# Patient Record
Sex: Male | Born: 2000 | Race: White | Hispanic: No | Marital: Single | Smoking: Current every day smoker
Health system: Southern US, Community
[De-identification: ages and names within clinical notes are randomized; demographics above are authoritative.]

## PROBLEM LIST (undated history)

## (undated) DIAGNOSIS — F909 Attention-deficit hyperactivity disorder, unspecified type: Secondary | ICD-10-CM

## (undated) HISTORY — PX: FOREARM SURGERY: SHX651

---
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
## 2018-07-31 DIAGNOSIS — K580 Irritable bowel syndrome with diarrhea: Secondary | ICD-10-CM | POA: Diagnosis not present

## 2018-07-31 DIAGNOSIS — R319 Hematuria, unspecified: Secondary | ICD-10-CM | POA: Diagnosis not present

## 2018-08-04 DIAGNOSIS — R319 Hematuria, unspecified: Secondary | ICD-10-CM | POA: Diagnosis not present

## 2018-08-04 DIAGNOSIS — N2889 Other specified disorders of kidney and ureter: Secondary | ICD-10-CM | POA: Diagnosis not present

## 2018-08-07 DIAGNOSIS — R319 Hematuria, unspecified: Secondary | ICD-10-CM | POA: Diagnosis not present

## 2018-08-07 DIAGNOSIS — K580 Irritable bowel syndrome with diarrhea: Secondary | ICD-10-CM | POA: Diagnosis not present

## 2018-08-17 DIAGNOSIS — Q615 Medullary cystic kidney: Secondary | ICD-10-CM | POA: Diagnosis not present

## 2018-08-17 DIAGNOSIS — N200 Calculus of kidney: Secondary | ICD-10-CM | POA: Diagnosis not present

## 2018-08-17 DIAGNOSIS — R310 Gross hematuria: Secondary | ICD-10-CM | POA: Diagnosis not present

## 2023-06-21 DIAGNOSIS — Z681 Body mass index (BMI) 19 or less, adult: Secondary | ICD-10-CM | POA: Diagnosis not present

## 2023-06-21 DIAGNOSIS — M545 Low back pain, unspecified: Secondary | ICD-10-CM | POA: Diagnosis not present

## 2023-06-21 DIAGNOSIS — Q615 Medullary cystic kidney: Secondary | ICD-10-CM | POA: Diagnosis not present

## 2023-06-21 DIAGNOSIS — F410 Panic disorder [episodic paroxysmal anxiety] without agoraphobia: Secondary | ICD-10-CM | POA: Diagnosis not present

## 2023-06-21 DIAGNOSIS — Z0000 Encounter for general adult medical examination without abnormal findings: Secondary | ICD-10-CM | POA: Diagnosis not present

## 2023-06-21 DIAGNOSIS — S63621A Sprain of interphalangeal joint of right thumb, initial encounter: Secondary | ICD-10-CM | POA: Diagnosis not present

## 2024-03-21 ENCOUNTER — Ambulatory Visit (INDEPENDENT_AMBULATORY_CARE_PROVIDER_SITE_OTHER): Payer: PRIVATE HEALTH INSURANCE | Admitting: Urology

## 2024-03-21 ENCOUNTER — Encounter: Payer: Self-pay | Admitting: Urology

## 2024-03-21 VITALS — BP 115/71 | HR 72 | Ht 65.0 in | Wt 108.0 lb

## 2024-03-21 DIAGNOSIS — R1011 Right upper quadrant pain: Secondary | ICD-10-CM

## 2024-03-21 DIAGNOSIS — N200 Calculus of kidney: Secondary | ICD-10-CM | POA: Diagnosis not present

## 2024-03-21 DIAGNOSIS — R109 Unspecified abdominal pain: Secondary | ICD-10-CM

## 2024-03-21 LAB — URINALYSIS, ROUTINE W REFLEX MICROSCOPIC
Bilirubin, UA: NEGATIVE
Glucose, UA: NEGATIVE
Ketones, UA: NEGATIVE
Leukocytes,UA: NEGATIVE
Nitrite, UA: NEGATIVE
Protein,UA: NEGATIVE
Specific Gravity, UA: 1.02 (ref 1.005–1.030)
Urobilinogen, Ur: 0.2 mg/dL (ref 0.2–1.0)
pH, UA: 6 (ref 5.0–7.5)

## 2024-03-21 LAB — MICROSCOPIC EXAMINATION

## 2024-03-21 NOTE — Progress Notes (Signed)
 Assessment: 1. Nephrolithiasis   2. Flank pain     Plan: I personally reviewed the patient's chart including provider notes, imaging results. Stone analysis sent. Recommend evaluation with CT renal stone study. His right upper quadrant pain is not consistent with renal colic. Will contact him with results of CT and stone analysis and to arrange follow-up.  Chief Complaint:  Chief Complaint  Patient presents with   Nephrolithiasis    History of Present Illness:  Bruce Jones is a 23 y.o. male who is seen for evaluation of nephrolithiasis. He has a history of kidney stones passing 3 stones previously.  He reports that his symptoms are typically left-sided.  He has recently had some left flank pain. Renal ultrasound from 03/09/2024 showed left hydroureter with echogenic focus in the distal left ureter measuring approximately 0.8 cm; right renal cyst. He passed a stone last week.  He is not having any left-sided flank pain.  He has some right upper quadrant pain with coughing and sneezing.  No definite right flank pain.  He has lower urinary tract symptoms including frequency, urgency, some pain with urination, nocturia, and incontinence.  No recent gross hematuria although he reports an episode 2 years ago. No recent CT imaging.   Past Medical History:  Past Medical History:  Diagnosis Date   Attention deficit hyperactivity disorder     Past Surgical History:  Past Surgical History:  Procedure Laterality Date   FOREARM SURGERY      Allergies:  No Known Allergies  Family History:  History reviewed. No pertinent family history.  Social History:  Social History   Tobacco Use   Smoking status: Every Day    Current packs/day: 0.50    Types: Cigarettes   Smokeless tobacco: Never   Tobacco comments:    Patient refused  Substance Use Topics   Alcohol use: No   Drug use: Yes    Types: Marijuana    Comment: also xanax once last friday    Review of symptoms:   Constitutional:  Negative for unexplained weight loss, night sweats, fever, chills ENT:  Negative for nose bleeds, sinus pain, painful swallowing CV:  Negative for chest pain, shortness of breath, exercise intolerance, palpitations, loss of consciousness Resp:  Negative for cough, wheezing, shortness of breath GI:  Negative for nausea, vomiting, diarrhea, bloody stools GU:  Positives noted in HPI; otherwise negative for gross hematuria, dysuria, urinary incontinence Neuro:  Negative for seizures, poor balance, limb weakness, slurred speech Psych:  Negative for lack of energy, depression, anxiety Endocrine:  Negative for polydipsia, polyuria, symptoms of hypoglycemia (dizziness, hunger, sweating) Hematologic:  Negative for anemia, purpura, petechia, prolonged or excessive bleeding, use of anticoagulants  Allergic:  Negative for difficulty breathing or choking as a result of exposure to anything; no shellfish allergy; no allergic response (rash/itch) to materials, foods  Physical exam: BP 115/71   Pulse 72   Ht 5' 5 (1.651 m)   Wt 108 lb (49 kg)   BMI 17.97 kg/m  GENERAL APPEARANCE:  Well appearing, well developed, well nourished, NAD HEENT: Atraumatic, Normocephalic, oropharynx clear. NECK: Supple without lymphadenopathy or thyromegaly. LUNGS: Clear to auscultation bilaterally. HEART: Regular Rate and Rhythm without murmurs, gallops, or rubs. ABDOMEN: Soft, non-tender, No Masses. EXTREMITIES: Moves all extremities well.  Without clubbing, cyanosis, or edema. NEUROLOGIC:  Alert and oriented x 3, normal gait, CN II-XII grossly intact.  MENTAL STATUS:  Appropriate. BACK:  Non-tender to palpation.  No CVAT SKIN:  Warm, dry and  intact.    Results: U/A: pH 6.0, 0-5 WBCs, 0-2 RBCs

## 2024-03-28 ENCOUNTER — Ambulatory Visit (HOSPITAL_COMMUNITY)
Admission: RE | Admit: 2024-03-28 | Discharge: 2024-03-28 | Disposition: A | Discharge status: Home / Self Care | Source: Ambulatory Visit | Attending: Urology | Admitting: Urology

## 2024-03-28 DIAGNOSIS — R109 Unspecified abdominal pain: Secondary | ICD-10-CM | POA: Diagnosis present

## 2024-03-28 DIAGNOSIS — N200 Calculus of kidney: Secondary | ICD-10-CM | POA: Diagnosis present

## 2024-03-29 ENCOUNTER — Ambulatory Visit: Payer: Self-pay | Admitting: Urology

## 2024-03-29 LAB — STONE ANALYSIS
Calcium Oxalate Dihydrate: 100 %
Weight Calculi: 17 mg

## 2024-03-29 NOTE — Telephone Encounter (Signed)
-----   Message from Adine Manly sent at 03/29/2024  1:07 PM EDT ----- Please notify the patient that his stone analysis showed calcium oxalate.   Awaiting final results on his CT.  Will let him know when available. ----- Message ----- From: Interface, [facility] Lab Results In Sent: 03/21/2024   4:36 PM EDT To: Adine DOROTHA Manly, MD

## 2024-03-29 NOTE — Telephone Encounter (Signed)
 [facility] requesting a return call.
